# Patient Record
Sex: Male | Born: 2000 | Hispanic: No | Marital: Single | Smoking: Current every day smoker
Health system: Southern US, Community
[De-identification: ages and names within clinical notes are randomized; demographics above are authoritative.]

## PROBLEM LIST (undated history)

## (undated) ENCOUNTER — Emergency Department (HOSPITAL_COMMUNITY): Admission: EM | Payer: Medicaid Other | Source: Home / Self Care

## (undated) DIAGNOSIS — R569 Unspecified convulsions: Secondary | ICD-10-CM

## (undated) DIAGNOSIS — K047 Periapical abscess without sinus: Secondary | ICD-10-CM

## (undated) DIAGNOSIS — G40909 Epilepsy, unspecified, not intractable, without status epilepticus: Secondary | ICD-10-CM

## (undated) HISTORY — DX: Periapical abscess without sinus: K04.7

---
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
## 2021-04-06 ENCOUNTER — Ambulatory Visit (HOSPITAL_COMMUNITY)
Admission: EM | Admit: 2021-04-06 | Discharge: 2021-04-06 | Disposition: A | Discharge status: Home / Self Care | Payer: Medicaid Other | Attending: Physician Assistant | Admitting: Physician Assistant

## 2021-04-06 ENCOUNTER — Encounter (HOSPITAL_COMMUNITY): Payer: Self-pay | Admitting: Emergency Medicine

## 2021-04-06 ENCOUNTER — Other Ambulatory Visit: Payer: Self-pay

## 2021-04-06 DIAGNOSIS — H60331 Swimmer's ear, right ear: Secondary | ICD-10-CM

## 2021-04-06 DIAGNOSIS — H66001 Acute suppurative otitis media without spontaneous rupture of ear drum, right ear: Secondary | ICD-10-CM

## 2021-04-06 MED ORDER — AMOXICILLIN-POT CLAVULANATE 875-125 MG PO TABS: 1.0000 | ORAL_TABLET | Freq: Two times a day (BID) | ORAL | 0 refills | Status: DC

## 2021-04-06 MED ORDER — OFLOXACIN 0.3 % OT SOLN: 5.0000 [drp] | Freq: Every day | OTIC | 0 refills | Status: DC

## 2021-04-06 NOTE — Discharge Instructions (Signed)
Take Augmentin twice daily for 7 days.  Use eardrops in right ear daily.  Keep ear facing upward for several minutes after using medication to allow it to get all the way into your ear canal.  Use Tylenol and ibuprofen for pain relief.  Do not use ear buds, earplugs in your ear until symptoms resolve.  Avoid submerging your head until symptoms improve.  If your pain continues or you have any worsening symptoms despite medication use you need to be reevaluated.

## 2021-04-06 NOTE — ED Provider Notes (Signed)
[facility]    CSN: [id number] Arrival date & time: 04/06/21  1309      History   Chief Complaint Chief Complaint  Patient presents with   Otalgia    Bilateral, 2 weeks    HPI Timothy Mitchell is a 20 y.o. male.   Patient presents today with a 2-week history of worsening otalgia.  Reports pain is present in both ears but much worse on the right.  Pain is rated 8 on a 0-10 pain scale, localized to right ear with radiation into neck, described as aching/throbbing, worse with manipulation of right ear, no alleviating factors identified.  Patient does report recent travel to [place] where he was swimming prior to symptom onset.  He has not tried any over-the-counter medications for symptom management.  Denies any otorrhea, fever, decreased hearing, congestion, cough.  He denies any recent antibiotic use.  Denies history of allergies or asthma.  He has not seen ENT in the past.   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 Authorizing Provider  amoxicillin-clavulanate (AUGMENTIN) 875-125 MG tablet Take 1 tablet by mouth every 12 (twelve) hours. 04/06/21  Yes Ora Bollig K, PA-C  ofloxacin (FLOXIN) 0.3 % OTIC solution Place 5 drops into the right ear daily. 04/06/21  Yes Vayda Dungee  K, PA-C  diazepam (DIASTAT ACUDIAL) 10 MG GEL Place 10 mg rectally once. Give per package directions Patient not taking: Reported on 03/03/2017 06/07/14   Marcellina Millin, MD  ibuprofen (ADVIL,MOTRIN) 200 MG tablet Take 400 mg by mouth every 6 (six) hours as needed (pain).    [provider]  levETIRAcetam (KEPPRA) 1000 MG tablet Take 1 tablet (1,000 mg total) by mouth 2 (two) times daily. Patient not taking: No sig reported 10/13/15   Niel Hummer, MD    Family History Family History  Problem Relation Age of Onset   Depression Mother    Learning disabilities Mother    Mental illness Mother    Mental illness Father    Birth defects Sister    Alcohol abuse Maternal Grandmother    Diabetes Maternal Grandmother    Alcohol abuse Maternal Grandfather    Cancer Maternal Grandfather    Diabetes Maternal Grandfather    Heart disease Maternal Grandfather    Diabetes Paternal Grandmother     Social History Social History   Tobacco Use   Smoking status: Passive Smoke Exposure - Never Smoker   Smokeless tobacco: Never  Substance Use Topics   Alcohol use: No   Drug use: No     Allergies   Patient has no known allergies.   Review of Systems Review of Systems  Constitutional:  Negative for activity change, appetite change, fatigue and fever.  HENT:  Positive for ear discharge and ear pain. Negative for congestion, sinus pressure, sneezing and sore throat.   Respiratory:  Negative for cough and shortness of breath.   Cardiovascular:  Negative for chest pain.  Gastrointestinal:  Negative for abdominal pain, diarrhea, nausea and vomiting.  Musculoskeletal:  Negative for arthralgias and myalgias.  Neurological:  Negative for dizziness, light-headedness and headaches.    Physical Exam Triage Vital Signs ED Triage Vitals [04/06/21 1401]  Enc Vitals Group     BP 131/77     Pulse Rate 77     Resp 16     Temp 98 F (36.7 C)     Temp Source Oral     SpO2 99 %     Weight       Height      Head Circumference      Peak Flow      Pain Score 8     Pain Loc      Pain Edu?      Excl. in GC?    No data found.  Updated Vital Signs BP 131/77   Pulse 77   Temp 98 F (36.7 C) (Oral)   Resp 16   SpO2 99%   Visual Acuity Right Eye Distance:   Left Eye Distance:   Bilateral Distance:    Right Eye Near:   Left Eye Near:    Bilateral Near:     Physical Exam Vitals reviewed.  Constitutional:      General: He is awake.     Appearance: Normal appearance. He is normal weight. He is not ill-appearing.     Comments: Very pleasant male appears stated age in no acute distress  HENT:     Head: Normocephalic and atraumatic.     Right Ear: Ear canal and external ear normal. Swelling and tenderness present. Tympanic membrane is erythematous and bulging.     Left Ear: Tympanic membrane, ear canal and external ear normal. Tympanic membrane is not erythematous or bulging.     Ears:     Comments: Right ear: Tenderness palpation of tragus and pain with manipulation of external ear.  External auditory canal erythematous and edematous.  TM appears bulging and erythematous    Nose: Nose normal.     Mouth/Throat:     Pharynx: Uvula midline. No oropharyngeal exudate or posterior oropharyngeal erythema.  Cardiovascular:     Rate and Rhythm: Normal rate and regular rhythm.     Heart sounds: Normal heart sounds, S1 normal and S2 normal. No murmur heard. Pulmonary:     Effort: Pulmonary effort is normal. No accessory muscle usage or respiratory distress.     Breath sounds: Normal breath sounds. No stridor. No wheezing, rhonchi or rales.     Comments: Clear to auscultation bilaterally Lymphadenopathy:     Head:     Right side of head: No submental, submandibular or tonsillar adenopathy.     Left side of head: No submental, submandibular or tonsillar adenopathy.     Cervical: No cervical adenopathy.  Neurological:     Mental Status: He is alert.  Psychiatric:         Behavior: Behavior is cooperative.     UC Treatments / Results  Labs (all labs ordered are listed, but only abnormal results are displayed) Labs Reviewed - No data to display  EKG   Radiology No results found.  Procedures Procedures (including critical care time)  Medications Ordered in UC Medications - No data to display  Initial  Impression / Assessment and Plan / UC Course  I have reviewed the triage vital signs and the nursing notes.  Pertinent labs & imaging results that were available during my care of the patient were reviewed by me and considered in my medical decision making (see chart for details).     Otitis externa and otitis media identified on physical exam.  Patient was started on Augmentin twice daily for 7 days.  He was prescribed ofloxacin otic drops with instruction to apply this daily and keep ear facing upward for 5 to 10 minutes after use of drops to allow complete penetration throughout ear canal.  He can alternate Tylenol and ibuprofen for pain relief.  Discouraged him from putting anything in his ear including earbuds, earplugs, Q-tips particularly until symptoms improve.  He is not to submerge his head in water until symptoms resolve.  Discussed alarm symptoms that warrant emergent evaluation.  Strict return precautions given to which patient expressed understanding.  Final Clinical Impressions(s) / UC Diagnoses   Final diagnoses:  Acute swimmer's ear of right side  Non-recurrent acute suppurative otitis media of right ear without spontaneous rupture of tympanic membrane     Discharge Instructions      Take Augmentin twice daily for 7 days.  Use eardrops in right ear daily.  Keep ear facing upward for several minutes after using medication to allow it to get all the way into your ear canal.  Use Tylenol and ibuprofen for pain relief.  Do not use ear buds, earplugs in your ear until symptoms resolve.  Avoid submerging your head until symptoms improve.   If your pain continues or you have any worsening symptoms despite medication use you need to be reevaluated.     ED Prescriptions     Medication Sig Dispense Auth. Provider   amoxicillin-clavulanate (AUGMENTIN) 875-125 MG tablet Take 1 tablet by mouth every 12 (twelve) hours. 14 tablet Alysse Rathe K, PA-C   ofloxacin (FLOXIN) 0.3 % OTIC solution Place 5 drops into the right ear daily. 5 mL Ronique Simerly K, PA-C      PDMP not reviewed this encounter.   Jeani Hawking, PA-C 04/06/21 1424

## 2021-06-01 ENCOUNTER — Encounter (HOSPITAL_COMMUNITY): Payer: Self-pay | Admitting: Emergency Medicine

## 2021-06-01 ENCOUNTER — Ambulatory Visit (HOSPITAL_COMMUNITY)
Admission: EM | Admit: 2021-06-01 | Discharge: 2021-06-01 | Disposition: A | Discharge status: Home / Self Care | Payer: Medicaid Other | Attending: Urgent Care | Admitting: Urgent Care

## 2021-06-01 ENCOUNTER — Emergency Department (HOSPITAL_COMMUNITY)
Admission: EM | Admit: 2021-06-01 | Discharge: 2021-06-01 | Disposition: A | Discharge status: Home / Self Care | Payer: Medicaid Other | Attending: Physician Assistant | Admitting: Physician Assistant

## 2021-06-01 ENCOUNTER — Other Ambulatory Visit: Payer: Self-pay

## 2021-06-01 DIAGNOSIS — Z87898 Personal history of other specified conditions: Secondary | ICD-10-CM

## 2021-06-01 DIAGNOSIS — K625 Hemorrhage of anus and rectum: Secondary | ICD-10-CM | POA: Diagnosis not present

## 2021-06-01 DIAGNOSIS — Z5321 Procedure and treatment not carried out due to patient leaving prior to being seen by health care provider: Secondary | ICD-10-CM | POA: Insufficient documentation

## 2021-06-01 DIAGNOSIS — R200 Anesthesia of skin: Secondary | ICD-10-CM

## 2021-06-01 LAB — CBC WITH DIFFERENTIAL/PLATELET
Abs Immature Granulocytes: 0.01 10*3/uL (ref 0.00–0.07)
Basophils Absolute: 0 10*3/uL (ref 0.0–0.1)
Basophils Relative: 1 %
Eosinophils Absolute: 0.2 10*3/uL (ref 0.0–0.5)
Eosinophils Relative: 3 %
HCT: 47.1 % (ref 39.0–52.0)
Hemoglobin: 15.7 g/dL (ref 13.0–17.0)
Immature Granulocytes: 0 %
Lymphocytes Relative: 48 %
Lymphs Abs: 3.1 10*3/uL (ref 0.7–4.0)
MCH: 29 pg (ref 26.0–34.0)
MCHC: 33.3 g/dL (ref 30.0–36.0)
MCV: 87.1 fL (ref 80.0–100.0)
Monocytes Absolute: 0.6 10*3/uL (ref 0.1–1.0)
Monocytes Relative: 9 %
Neutro Abs: 2.5 10*3/uL (ref 1.7–7.7)
Neutrophils Relative %: 39 %
Platelets: 329 10*3/uL (ref 150–400)
RBC: 5.41 MIL/uL (ref 4.22–5.81)
RDW: 13 % (ref 11.5–15.5)
WBC: 6.3 10*3/uL (ref 4.0–10.5)
nRBC: 0 % (ref 0.0–0.2)

## 2021-06-01 LAB — TYPE AND SCREEN
ABO/RH(D): O POS
Antibody Screen: NEGATIVE

## 2021-06-01 LAB — COMPREHENSIVE METABOLIC PANEL
ALT: 56 U/L — ABNORMAL HIGH (ref 0–44)
AST: 34 U/L (ref 15–41)
Albumin: 4 g/dL (ref 3.5–5.0)
Alkaline Phosphatase: 75 U/L (ref 38–126)
Anion gap: 7 (ref 5–15)
BUN: 13 mg/dL (ref 6–20)
CO2: 26 mmol/L (ref 22–32)
Calcium: 9.4 mg/dL (ref 8.9–10.3)
Chloride: 103 mmol/L (ref 98–111)
Creatinine, Ser: 1.04 mg/dL (ref 0.61–1.24)
GFR, Estimated: >60 mL/min (ref >60)
Glucose, Bld: 129 mg/dL — ABNORMAL HIGH (ref 70–99)
Potassium: 3.8 mmol/L (ref 3.5–5.1)
Sodium: 136 mmol/L (ref 135–145)
Total Bilirubin: 0.7 mg/dL (ref 0.3–1.2)
Total Protein: 7.2 g/dL (ref 6.5–8.1)

## 2021-06-01 LAB — URINALYSIS, ROUTINE W REFLEX MICROSCOPIC
Bilirubin Urine: NEGATIVE
Glucose, UA: NEGATIVE mg/dL
Hgb urine dipstick: NEGATIVE
Ketones, ur: NEGATIVE mg/dL
Leukocytes,Ua: NEGATIVE
Nitrite: NEGATIVE
Protein, ur: NEGATIVE mg/dL
Specific Gravity, Urine: 1.029 (ref 1.005–1.030)
pH: 5 (ref 5.0–8.0)

## 2021-06-01 LAB — ABO/RH: ABO/RH(D): O POS

## 2021-06-01 MED ORDER — DOCUSATE SODIUM 100 MG PO CAPS: 100.0000 mg | ORAL_CAPSULE | Freq: Two times a day (BID) | ORAL | 0 refills | Status: DC

## 2021-06-01 NOTE — ED Triage Notes (Signed)
Patient with rectal bleeding this am.  Patient denies any abdominal pain, nausea or vomiting.  He states that he had a BM and there was BRB on the toilet paper.

## 2021-06-01 NOTE — ED Notes (Signed)
Mother wanted to take son to Urgent care due to wait time.

## 2021-06-01 NOTE — ED Provider Notes (Signed)
Emergency Medicine Provider Triage Evaluation Note  Timothy Mitchell , a 20 y.o. male  was evaluated in triage.  Pt complains of one episode of BRBPR while having a bowel movement earlier tonight.  Pt denies known constipation or straining.  Reports the blood was bright red and on the toilet paper.  Denies abd pain, vomiting, diarrhea.  Is not anticoagulated.  Review of Systems  Positive: BRBPR Negative: Abd pain, vomiting  Physical Exam  BP 129/73 (BP Location: Left Arm)   Pulse 61   Temp 98.4 F (36.9 C)   Resp 16   SpO2 96%  Gen:   Awake, no distress   Resp:  Normal effort  MSK:   Moves extremities without difficulty  Other:  Abd soft, nondistended  Medical Decision Making  Medically screening exam initiated at 6:53 AM.  Appropriate orders placed.  Timothy Mitchell was informed that the remainder of the evaluation will be completed by another provider, this initial triage assessment does not replace that evaluation, and the importance of remaining in the ED until their evaluation is complete.  Blood in stool   [name], [name] 06/01/21 5701    Tilden Fossa, MD 06/01/21 [phone]

## 2021-06-01 NOTE — ED Triage Notes (Signed)
Pt presents with blood in stool xs 3 times since this am. States was bloated a couple days ago.   Also states had a numbness feeling on back of head around 530 this am.

## 2021-06-02 ENCOUNTER — Telehealth: Payer: Self-pay

## 2021-06-02 NOTE — Telephone Encounter (Signed)
Transition Care Management Unsuccessful Follow-up Telephone Call  Date of discharge and from where:  06/01/2021-[facility]   Attempts:  1st Attempt  Reason for unsuccessful TCM follow-up call:  Unable to reach patient

## 2021-06-03 NOTE — Telephone Encounter (Signed)
Transition Care Management Unsuccessful Follow-up Telephone Call  Date of discharge and from where:  06/01/2021-[facility]  Attempts:  2nd Attempt  Reason for unsuccessful TCM follow-up call:  Unable to reach patient

## 2021-06-06 NOTE — Telephone Encounter (Signed)
Transition Care Management Unsuccessful Follow-up Telephone Call  Date of discharge and from where:  06/01/2021 from [facility]  Attempts:  3rd Attempt  Reason for unsuccessful TCM follow-up call:  Unable to reach patient

## 2022-06-30 ENCOUNTER — Ambulatory Visit (HOSPITAL_COMMUNITY)
Admission: EM | Admit: 2022-06-30 | Discharge: 2022-06-30 | Disposition: A | Discharge status: Home / Self Care | Payer: Medicaid Other | Attending: Internal Medicine | Admitting: Internal Medicine

## 2022-06-30 ENCOUNTER — Other Ambulatory Visit: Payer: Self-pay

## 2022-06-30 ENCOUNTER — Ambulatory Visit (INDEPENDENT_AMBULATORY_CARE_PROVIDER_SITE_OTHER): Payer: Medicaid Other

## 2022-06-30 ENCOUNTER — Encounter (HOSPITAL_COMMUNITY): Payer: Self-pay

## 2022-06-30 DIAGNOSIS — S62334A Displaced fracture of neck of fourth metacarpal bone, right hand, initial encounter for closed fracture: Secondary | ICD-10-CM

## 2022-06-30 DIAGNOSIS — S62306A Unspecified fracture of fifth metacarpal bone, right hand, initial encounter for closed fracture: Secondary | ICD-10-CM | POA: Diagnosis not present

## 2022-06-30 DIAGNOSIS — M79641 Pain in right hand: Secondary | ICD-10-CM | POA: Diagnosis not present

## 2022-06-30 DIAGNOSIS — M7989 Other specified soft tissue disorders: Secondary | ICD-10-CM | POA: Diagnosis not present

## 2022-06-30 DIAGNOSIS — S62339A Displaced fracture of neck of unspecified metacarpal bone, initial encounter for closed fracture: Secondary | ICD-10-CM

## 2022-06-30 MED ORDER — TRAMADOL HCL 50 MG PO TABS: 50.0000 mg | ORAL_TABLET | Freq: Four times a day (QID) | ORAL | 0 refills | Status: AC | PRN

## 2022-06-30 NOTE — ED Provider Notes (Signed)
[facility]    CSN: [id number] Arrival date & time: 06/30/22  1632      History   Chief Complaint Chief Complaint  Patient presents with   Hand Pain    HPI Timothy Mitchell is a 21 y.o. male.   21 year old male presents today due to concern of right hand pain.  He states 2 or 3 hours prior to arrival, out of anger and frustration he punched a tree.  He states he had immediate pain and swelling to the medial aspect of his hand.  Mom states she gave him 800 mg of ibuprofen roughly an hour prior to arrival.  He had a similar injury roughly a year ago, but states he never was seen for evaluation at that time.  He is and certain of any prior injuries.  He denies any loss of sensation or tingling in his fingers.   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 Authorizing Provider  traMADol (ULTRAM) 50 MG tablet Take 1 tablet (50 mg total) by mouth every 6 (six) hours as needed for up to 3 days for severe pain. 06/30/22 07/03/22 Yes Josclyn Rosales, Jodelle Gross, PA    Family History Family History  Problem Relation Age of Onset   Depression Mother    Learning disabilities Mother    Mental illness Mother    Mental illness Father    Birth defects Sister    Alcohol abuse  Maternal Grandmother    Diabetes Maternal Grandmother    Alcohol abuse Maternal Grandfather    Cancer Maternal Grandfather    Diabetes Maternal Grandfather    Heart disease Maternal Grandfather    Diabetes Paternal Grandmother     Social History Social History   Tobacco Use   Smoking status: Passive Smoke Exposure - Never Smoker   Smokeless tobacco: Never  Substance Use Topics   Alcohol use: No   Drug use: No     Allergies   Patient has no known allergies.   Review of Systems Review of Systems As per HPI  Physical Exam Triage Vital Signs ED Triage Vitals  Enc Vitals Group     BP 06/30/22 1816 137/71     Pulse Rate 06/30/22 1816 77     Resp 06/30/22 1816 18     Temp 06/30/22 1816 98.3 F (36.8 C)     Temp Source 06/30/22 1816 Oral     SpO2 06/30/22 1816 100 %     Weight --      Height --      Head Circumference --      Peak Flow --      Pain Score 06/30/22 1814 7     Pain Loc --  Pain Edu? --      Excl. in [place]? --    No data found.  Updated Vital Signs BP 137/71 (BP Location: Left Arm)   Pulse 77   Temp 98.3 F (36.8 C) (Oral)   Resp 18   SpO2 100%   Visual Acuity Right Eye Distance:   Left Eye Distance:   Bilateral Distance:    Right Eye Near:   Left Eye Near:    Bilateral Near:     Physical Exam Vitals and nursing note reviewed. Exam conducted with a chaperone present.  Constitutional:      General: He is not in acute distress.    Appearance: Normal appearance. He is normal weight. He is not ill-appearing or toxic-appearing.     Comments: Mild discomfort to R hand  HENT:     Head: Normocephalic and atraumatic.  Cardiovascular:     Rate and Rhythm: Normal rate.  Pulmonary:     Effort: Pulmonary effort is normal. No respiratory distress.  Musculoskeletal:        General: Swelling, tenderness and signs of injury present. Normal range of motion.     Comments: Moderate soft tissue swelling and ecchymosis noted to dorsal aspect of medial  hand over 4th and 5th metacarpal.  Skin:    General: Skin is warm.     Capillary Refill: Capillary refill takes less than 2 seconds.     Findings: No erythema or rash.  Neurological:     Mental Status: He is alert.     Sensory: No sensory deficit.     Comments: Decreased grip strength primarily due to swelling      UC Treatments / Results  Labs (all labs ordered are listed, but only abnormal results are displayed) Labs Reviewed - No data to display  EKG   Radiology DG Hand Complete Right  Result Date: 06/30/2022 CLINICAL DATA:  Right hand pain, punched a tree EXAM: RIGHT HAND - COMPLETE 3 VIEW COMPARISON:  07/13/2017 FINDINGS: Mildly displaced fractures with volar angulation in the mid fourth metacarpal and proximal fifth metacarpal. Neither fracture involves the articular surface. The joint spaces are preserved. Alignment is otherwise unremarkable. Soft tissue swelling about the hand. IMPRESSION: Fractures through the mid fourth metacarpal (ring finger) and proximal fifth metacarpal (pinky). Electronically Signed   By: Merilyn Baba M.D.   On: 06/30/2022 18:52    Procedures Procedures (including critical care time)  Medications Ordered in UC Medications - No data to display  Initial Impression / Assessment and Plan / UC Course  I have reviewed the triage vital signs and the nursing notes.  Pertinent labs & imaging results that were available during my care of the patient were reviewed by me and considered in my medical decision making (see chart for details).     4th and 5th metacarpal minimally displaced fractures - pt already had 800 mg ibuprofen administered prior to arrival.  Currently in a 7 out of 10 pain.  Will discharge with 3 days of tramadol for severe pain.  Recommended continued ibuprofen use every 6-8 hours, in addition to ice.  Ulnar gutter splint placed.  Patient instructed to call and follow-up with Ortho tomorrow.   Final Clinical Impressions(s) / UC  Diagnoses   Final diagnoses:  Closed boxer's fracture, initial encounter     Discharge Instructions      You have a fracture of your fourth and fifth metacarpal bone. Please continue taking 600 mg of ibuprofen with food every 6 hours. Take  the tramadol prescribed today up to every 6 hours as needed for severe pain. Please ice the area at least 3 times daily. Call [facility] in the morning to schedule a follow-up evaluation.   ED Prescriptions     Medication Sig Dispense Auth. Provider   traMADol (ULTRAM) 50 MG tablet Take 1 tablet (50 mg total) by mouth every 6 (six) hours as needed for up to 3 days for severe pain. 12 tablet Danica Camarena L, [place]      I have reviewed the PDMP during this encounter.   Maretta Bees, [place] 06/30/22 2014

## 2022-06-30 NOTE — Discharge Instructions (Addendum)
You have a fracture of your fourth and fifth metacarpal bone. Please continue taking 600 mg of ibuprofen with food every 6 hours. Take the tramadol prescribed today up to every 6 hours as needed for severe pain. Please ice the area at least 3 times daily. Call [facility] in the morning to schedule a follow-up evaluation.

## 2022-06-30 NOTE — ED Triage Notes (Addendum)
Patient with c/o right hand pain. States he punched a tree out of frustration. Swelling noted to top of right hand.

## 2022-06-30 NOTE — ED Notes (Signed)
Ortho tech called for splint placement. 

## 2022-06-30 NOTE — Progress Notes (Signed)
Orthopedic Tech Progress Note Patient Details:  Timothy Mitchell 2001-09-21 [id number]  Ortho Devices Type of Ortho Device: Ulna gutter splint Ortho Device/Splint Location: rue Ortho Device/Splint Interventions: Ordered, Application, Adjustment   Post Interventions Patient Tolerated: Well Instructions Provided: Care of device, Adjustment of device  Karolee Stamps 06/30/2022, 8:26 PM

## 2022-07-07 DIAGNOSIS — M79672 Pain in left foot: Secondary | ICD-10-CM | POA: Diagnosis not present

## 2022-07-07 DIAGNOSIS — M79641 Pain in right hand: Secondary | ICD-10-CM | POA: Diagnosis not present

## 2022-07-07 DIAGNOSIS — M79671 Pain in right foot: Secondary | ICD-10-CM | POA: Diagnosis not present

## 2022-07-18 DIAGNOSIS — S62346D Nondisplaced fracture of base of fifth metacarpal bone, right hand, subsequent encounter for fracture with routine healing: Secondary | ICD-10-CM | POA: Diagnosis not present

## 2022-07-18 DIAGNOSIS — S62354A Nondisplaced fracture of shaft of fourth metacarpal bone, right hand, initial encounter for closed fracture: Secondary | ICD-10-CM | POA: Diagnosis not present

## 2022-07-26 DIAGNOSIS — S62354A Nondisplaced fracture of shaft of fourth metacarpal bone, right hand, initial encounter for closed fracture: Secondary | ICD-10-CM | POA: Diagnosis not present

## 2022-07-26 DIAGNOSIS — S62346D Nondisplaced fracture of base of fifth metacarpal bone, right hand, subsequent encounter for fracture with routine healing: Secondary | ICD-10-CM | POA: Diagnosis not present

## 2023-02-19 ENCOUNTER — Ambulatory Visit (HOSPITAL_COMMUNITY)
Admission: EM | Admit: 2023-02-19 | Discharge: 2023-02-19 | Disposition: A | Discharge status: Home / Self Care | Payer: Medicaid Other | Attending: Internal Medicine | Admitting: Internal Medicine

## 2023-02-19 ENCOUNTER — Encounter (HOSPITAL_COMMUNITY): Payer: Self-pay | Admitting: *Deleted

## 2023-02-19 ENCOUNTER — Other Ambulatory Visit: Payer: Self-pay

## 2023-02-19 DIAGNOSIS — J029 Acute pharyngitis, unspecified: Secondary | ICD-10-CM | POA: Diagnosis not present

## 2023-02-19 LAB — POCT RAPID STREP A (OFFICE): Rapid Strep A Screen: NEGATIVE

## 2023-02-19 MED ORDER — AMOXICILLIN 500 MG PO CAPS: 1000.0000 mg | ORAL_CAPSULE | Freq: Every day | ORAL | 0 refills | Status: AC

## 2023-02-19 NOTE — Discharge Instructions (Addendum)
Your rapid strep was negative today in clinic, however, your physical exam is consistent with strep throat.  Please take all antibiotics as prescribed and until finished.  You can take them with food to prevent gastrointestinal upset.  For symptomatic relief of your sore throat you can alternate between Tylenol and ibuprofen every 4-6 hours.  You can do tea with honey, warm saline gargles and sleep with a humidifier.  Change out your toothbrush in the next day.  Please return to clinic or to your primary care provider if you do not have any improvement in your symptoms despite being on antibiotics, you develop shortness of breath, wheezing, or any new concerning symptoms.

## 2023-02-19 NOTE — ED Provider Notes (Signed)
[facility]    CSN: [id number] Arrival date & time: 02/19/23  1116      History   Chief Complaint Chief Complaint  Patient presents with   Sore Throat   Headache    HPI Timothy Mitchell is a 22 y.o. male.   Patient presents to clinic for complaints of a sore throat and headache for the past 2 days.  He has had hot and cold chills.  Has not checked his temperature.  He had tried some tea and this helped a sore throat.  Denies recent sick contacts.  Does report a nonproductive cough.  Denies any abdominal pain, nausea, vomiting or diarrhea. Able to eat and drink.    The history is provided by the patient and medical records.  Sore Throat Associated symptoms include headaches. Pertinent negatives include no chest pain, no abdominal pain and no shortness of breath.  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 Authorizing Provider  amoxicillin (AMOXIL) 500 MG capsule Take 2 capsules (1,000 mg total) by mouth daily for 10 days. 02/19/23 03/01/23 Yes Kandon Hosking, [place] N, FNP    Family History Family History  Problem Relation Age of Onset    Depression Mother    Learning disabilities Mother    Mental illness Mother    Mental illness Father    Birth defects Sister    Alcohol abuse Maternal Grandmother    Diabetes Maternal Grandmother    Alcohol abuse Maternal Grandfather    Cancer Maternal Grandfather    Diabetes Maternal Grandfather    Heart disease Maternal Grandfather    Diabetes Paternal Grandmother     Social History Social History   Tobacco Use   Smoking status: Passive Smoke Exposure - Never Smoker   Smokeless tobacco: Never  Substance Use Topics   Alcohol use: No   Drug use: No     Allergies   Patient has no known allergies.   Review of Systems Review of Systems  Constitutional:  Positive for chills.  HENT:  Positive for sore throat. Negative for trouble swallowing and voice change.   Respiratory:  Positive for cough. Negative for shortness of breath and wheezing.   Cardiovascular:  Negative for chest pain.  Gastrointestinal:  Negative for abdominal pain, diarrhea, nausea and vomiting.  Neurological:  Positive for headaches.     Physical Exam Triage Vital Signs ED Triage Vitals  Enc Vitals Group     BP 02/19/23 1150 128/79     Pulse Rate 02/19/23 1150 83     Resp 02/19/23 1150 16     Temp  02/19/23 1150 99.1 F (37.3 C)     Temp src --      SpO2 02/19/23 1150 98 %     Weight --      Height --      Head Circumference --      Peak Flow --      Pain Score 02/19/23 1149 7     Pain Loc --      Pain Edu? --      Excl. in [place]? --    No data found.  Updated Vital Signs BP 128/79   Pulse 83   Temp 99.1 F (37.3 C)   Resp 16   SpO2 98%   Visual Acuity Right Eye Distance:   Left Eye Distance:   Bilateral Distance:    Right Eye Near:   Left Eye Near:    Bilateral Near:     Physical Exam Vitals and nursing note reviewed.  Constitutional:      Appearance: He is well-developed.  HENT:     Head: Normocephalic and atraumatic.     Nose: No congestion or rhinorrhea.      Mouth/Throat:     Lips: Pink.     Mouth: Mucous membranes are moist.     Pharynx: Uvula midline. Posterior oropharyngeal erythema present.     Tonsils: Tonsillar exudate present. No tonsillar abscesses. 2+ on the right. 2+ on the left.  Eyes:     Conjunctiva/sclera: Conjunctivae normal.  Cardiovascular:     Rate and Rhythm: Normal rate and regular rhythm.     Heart sounds: Normal heart sounds. No murmur heard. Pulmonary:     Effort: Pulmonary effort is normal. No respiratory distress.     Breath sounds: Normal breath sounds.  Musculoskeletal:     Cervical back: Normal range of motion.  Neurological:     General: No focal deficit present.     Mental Status: He is alert and oriented to person, place, and time.  Psychiatric:        Mood and Affect: Mood normal.        Behavior: Behavior normal.      UC Treatments / Results  Labs (all labs ordered are listed, but only abnormal results are displayed) Labs Reviewed  CULTURE, GROUP A STREP [facility])  POCT RAPID STREP A (OFFICE)    EKG   Radiology No results found.  Procedures Procedures (including critical care time)  Medications Ordered in UC Medications - No data to display  Initial Impression / Assessment and Plan / UC Course  I have reviewed the triage vital signs and the nursing notes.  Pertinent labs & imaging results that were available during my care of the patient were reviewed by me and considered in my medical decision making (see chart for details).  Vitals in triage reviewed, patient is hemodynamically stable.  Posterior pharynx with erythema, uvula midline, tonsils with 2+ edema and bilateral scant white patches.  Low concern for peritonsillar abscess.  Rapid strep negative, will send for culture.  Due to symptoms and presentation, will cover with amoxicillin for strep pharyngitis. Symptomatic management reviewed.  Plan of care, follow-up care and return precautions discussed, no questions at this time.      Final Clinical Impressions(s) / UC Diagnoses   Final diagnoses:  Pharyngitis, unspecified etiology     Discharge Instructions      Your rapid strep was negative today in clinic, however, your physical exam is consistent with strep throat.  Please take all antibiotics as  prescribed and until finished.  You can take them with food to prevent gastrointestinal upset.  For symptomatic relief of your sore throat you can alternate between Tylenol and ibuprofen every 4-6 hours.  You can do tea with honey, warm saline gargles and sleep with a humidifier.  Change out your toothbrush in the next day.  Please return to clinic or to your primary care provider if you do not have any improvement in your symptoms despite being on antibiotics, you develop shortness of breath, wheezing, or any new concerning symptoms.      ED Prescriptions     Medication Sig Dispense Auth. Provider   amoxicillin (AMOXIL) 500 MG capsule Take 2 capsules (1,000 mg total) by mouth daily for 10 days. 20 capsule Carolene Gitto, [place] N, [place]      PDMP not reviewed this encounter.   Aedyn Mckeon, [place] N, [place] 02/19/23 1218

## 2023-02-19 NOTE — ED Triage Notes (Signed)
Pt reports he has a sore throat for 2 days.with a HA.

## 2023-02-21 LAB — CULTURE, GROUP A STREP (THRC)

## 2023-04-25 ENCOUNTER — Ambulatory Visit (HOSPITAL_COMMUNITY)
Admission: EM | Admit: 2023-04-25 | Discharge: 2023-04-25 | Disposition: A | Discharge status: Home / Self Care | Payer: Medicaid Other

## 2023-04-25 ENCOUNTER — Encounter (HOSPITAL_COMMUNITY): Payer: Self-pay

## 2023-04-25 DIAGNOSIS — F439 Reaction to severe stress, unspecified: Secondary | ICD-10-CM

## 2023-04-25 NOTE — ED Triage Notes (Signed)
Pt states he needs a note for work. States he was with his significant other yesterday and she was threatening suicide. Pt denies any suicidal ideations just states he went through a lot with her.

## 2023-04-25 NOTE — Discharge Instructions (Addendum)
You have been provided information for you for [facility] for urgent care services as needed.

## 2023-04-25 NOTE — ED Provider Notes (Signed)
[facility]    CSN: [id number] Arrival date & time: 04/25/23  1154      History   Chief Complaint No chief complaint on file.   HPI Timothy Mitchell is a 22 y.o. male.   HPI  He is in today for for a work note.  He reports that he was caring for his 71 year old ex-girlfriend who is having some suicidal ideation.  He reports that she is doing better now.  He has a new job and he wants to protect his job.  He denies any suicidal ideations.   Past Medical History:  Diagnosis Date   Dental abscess 08/22/2011   Seizure disorder (HCC)    Last seizure seen approx 2 years ago   Seizures [facility])     Patient Active Problem List   Diagnosis Date Noted   Suicidal ideation 10/05/2017   Abnormal finding on EKG 12/17/2015   Seizure disorder, grand mal (HCC) 08/22/2011   Absence seizure disorder (HCC) 08/22/2011    Past Surgical History:  Procedure Laterality Date   NERVE, TENDON AND ARTERY REPAIR Right 06/10/2014   Procedure: RIGHT RING FINGER EXPLORATION WITH NERVE  REPAIR;  Surgeon: Betha Loa, MD;  Location: [facility];  Service: Orthopedics;  Laterality: Right;       Home Medications    Prior to Admission medications   Not on File    Family History Family History  Problem Relation Age of Onset   Depression Mother    Learning disabilities Mother    Mental illness Mother    Mental illness Father    Birth defects Sister    Alcohol abuse Maternal Grandmother    Diabetes Maternal Grandmother    Alcohol abuse Maternal Grandfather    Cancer Maternal Grandfather    Diabetes Maternal Grandfather    Heart disease Maternal Grandfather    Diabetes Paternal Grandmother     Social History Social History   Tobacco Use   Smoking status: Passive Smoke Exposure - Never Smoker   Smokeless tobacco: Never  Substance Use Topics   Alcohol use: No   Drug use: No     Allergies   Patient has no known allergies.   Review of Systems Review of  Systems   Physical Exam Triage Vital Signs ED Triage Vitals  Encounter Vitals Group     BP 04/25/23 1220 119/78     Systolic BP Percentile --      Diastolic BP Percentile --      Pulse Rate 04/25/23 1220 80     Resp 04/25/23 1220 16     Temp 04/25/23 1220 98.3 F (36.8 C)     Temp Source 04/25/23 1220 Oral     SpO2 04/25/23 1220 96 %     Weight --      Height --      Head Circumference --      Peak Flow --      Pain Score 04/25/23 1222 0     Pain Loc --      Pain Education --      Exclude from Growth Chart --    No data found.  Updated Vital Signs BP 119/78 (BP Location: Left Arm)   Pulse 80   Temp 98.3 F (36.8 C) (Oral)   Resp 16   SpO2 96%   Visual Acuity Right Eye Distance:   Left Eye Distance:   Bilateral Distance:    Right Eye Near:   Left Eye Near:  Bilateral Near:     Physical Exam Constitutional:      General: He is not in acute distress.    Appearance: He is obese.  HENT:     Head: Normocephalic and atraumatic.  Cardiovascular:     Rate and Rhythm: Normal rate.  Pulmonary:     Effort: Pulmonary effort is normal.  Musculoskeletal:        General: Normal range of motion.  Skin:    General: Skin is warm and dry.     Capillary Refill: Capillary refill takes less than 2 seconds.  Neurological:     General: No focal deficit present.     Mental Status: He is alert.  Psychiatric:     Comments: Quite but expressed appreciation post vist       UC Treatments / Results  Labs (all labs ordered are listed, but only abnormal results are displayed) Labs Reviewed - No data to display  EKG   Radiology No results found.  Procedures Procedures (including critical care time)  Medications Ordered in UC Medications - No data to display  Initial Impression / Assessment and Plan / UC Course  I have reviewed the triage vital signs and the nursing notes.  Pertinent labs & imaging results that were available during my care of the patient were  reviewed by me and considered in my medical decision making (see chart for details).     Work note Final Clinical Impressions(s) / UC Diagnoses   Final diagnoses:  Stress     Discharge Instructions      You have been provided information for you for [facility] for urgent care services as needed.     ED Prescriptions   None    PDMP not reviewed this encounter.   Thad Ranger [place], [place] 04/25/23 1354

## 2023-10-09 ENCOUNTER — Ambulatory Visit (HOSPITAL_COMMUNITY)
Admission: EM | Admit: 2023-10-09 | Discharge: 2023-10-09 | Disposition: A | Discharge status: Home / Self Care | Payer: Self-pay | Attending: Internal Medicine | Admitting: Internal Medicine

## 2023-10-09 ENCOUNTER — Encounter (HOSPITAL_COMMUNITY): Payer: Self-pay | Admitting: Emergency Medicine

## 2023-10-09 DIAGNOSIS — Z202 Contact with and (suspected) exposure to infections with a predominantly sexual mode of transmission: Secondary | ICD-10-CM | POA: Insufficient documentation

## 2023-10-09 MED ORDER — METRONIDAZOLE 500 MG PO TABS: 500.0000 mg | ORAL_TABLET | Freq: Two times a day (BID) | ORAL | 0 refills | Status: DC

## 2023-10-09 NOTE — Discharge Instructions (Addendum)
Please abstain from sexual intercourse until lab results are available I have sent metronidazole to the pharmacy.  Please pick the metronidazole up if trichomonas is positive If your test results are positive you will need to abstain from sexual intercourse for 7 days after treatment is started. Will call you with recommendations if labs are abnormal Feel free to return to urgent care if you have any other concerns

## 2023-10-09 NOTE — ED Provider Notes (Signed)
[facility]    CSN: [id number] Arrival date & time: 10/09/23  0909      History   Chief Complaint Chief Complaint  Patient presents with   Exposure to STD    HPI Timothy Mitchell is a 22 y.o. male comes to the urgent care for STD testing.  He was recently told by sexual partner that she had been diagnosed and treated for trichomonas infection.  Patient has been sexually involved with her.  The sexual encounter was unprotected.  Patient has no symptoms. HPI  Past Medical History:  Diagnosis Date   Dental abscess 08/22/2011   Seizure disorder (HCC)    Last seizure seen approx 2 years ago   Seizures [facility])     Patient Active Problem List   Diagnosis Date Noted   Suicidal ideation 10/05/2017   Abnormal finding on EKG 12/17/2015   Seizure disorder, grand mal (HCC) 08/22/2011   Absence seizure disorder (HCC) 08/22/2011    Past Surgical History:  Procedure Laterality Date   NERVE, TENDON AND ARTERY REPAIR Right 06/10/2014   Procedure: RIGHT RING FINGER EXPLORATION WITH NERVE  REPAIR;  Surgeon: Betha Loa, MD;  Location: [facility];  Service: Orthopedics;  Laterality: Right;       Home Medications    Prior to Admission medications   Medication Sig Start Date End Date Taking? Authorizing Provider  metroNIDAZOLE (FLAGYL) 500 MG tablet Take 1 tablet (500 mg total) by mouth 2 (two) times daily. 10/09/23  Yes Kavita Bartl, Britta Mccreedy, MD    Family History Family History  Problem Relation Age of Onset   Depression Mother    Learning disabilities Mother    Mental illness Mother    Mental illness Father    Birth defects Sister    Alcohol abuse Maternal Grandmother    Diabetes Maternal Grandmother    Alcohol abuse Maternal Grandfather    Cancer Maternal Grandfather    Diabetes Maternal Grandfather    Heart disease Maternal Grandfather    Diabetes Paternal Grandmother     Social History Social History   Tobacco Use   Smoking status: Every Day     Types: Cigarettes    Passive exposure: Yes   Smokeless tobacco: Never  Vaping Use   Vaping status: Never Used  Substance Use Topics   Alcohol use: No   Drug use: Yes    Types: Marijuana     Allergies   Patient has no known allergies.   Review of Systems Review of Systems  All other systems reviewed and are negative.    Physical Exam Triage Vital Signs ED Triage Vitals  Encounter Vitals Group     BP 10/09/23 0958 112/71     Systolic BP Percentile --      Diastolic BP Percentile --      Pulse Rate 10/09/23 0958 78     Resp 10/09/23 0958 18     Temp 10/09/23 0958 98 F (36.7 C)     Temp Source 10/09/23 0958 Oral     SpO2 10/09/23 0958 95 %     Weight 10/09/23 1000 240 lb (108.9 kg)     Height 10/09/23 1000 6\' 1"  (1.854 m)     Head Circumference --      Peak Flow --      Pain Score 10/09/23 0959 0     Pain Loc --      Pain Education --      Exclude from Growth Chart --  No data found.  Updated Vital Signs BP 112/71 (BP Location: Left Arm)   Pulse 78   Temp 98 F (36.7 C) (Oral)   Resp 18   Ht 6\' 1"  (1.854 m)   Wt 108.9 kg   SpO2 95%   BMI 31.66 kg/m   Visual Acuity Right Eye Distance:   Left Eye Distance:   Bilateral Distance:    Right Eye Near:   Left Eye Near:    Bilateral Near:     Physical Exam Vitals and nursing note reviewed.  Constitutional:      General: He is not in acute distress.    Appearance: He is not ill-appearing.  Genitourinary:    Comments: Deferred Neurological:     General: No focal deficit present.     Mental Status: He is alert and oriented to person, place, and time.  Psychiatric:        Mood and Affect: Mood normal.        Behavior: Behavior normal.      UC Treatments / Results  Labs (all labs ordered are listed, but only abnormal results are displayed) Labs Reviewed  CYTOLOGY, (ORAL, ANAL, URETHRAL) ANCILLARY ONLY    EKG   Radiology No results found.  Procedures Procedures (including critical care  time)  Medications Ordered in UC Medications - No data to display  Initial Impression / Assessment and Plan / UC Course  I have reviewed the triage vital signs and the nursing notes.  Pertinent labs & imaging results that were available during my care of the patient were reviewed by me and considered in my medical decision making (see chart for details).     1.  STD screening: Cytology for GC/chlamydia/trichomonas Metronidazole 500 mg twice daily for 7 days Abstinence from sexual intercourse emphasized Return precautions given Final Clinical Impressions(s) / UC Diagnoses   Final diagnoses:  Exposure to trichomonas     Discharge Instructions      Please abstain from sexual intercourse until lab results are available I have sent metronidazole to the pharmacy.  Please pick the metronidazole up if trichomonas is positive If your test results are positive you will need to abstain from sexual intercourse for 7 days after treatment is started. Will call you with recommendations if labs are abnormal Feel free to return to urgent care if you have any other concerns   ED Prescriptions     Medication Sig Dispense Auth. Provider   metroNIDAZOLE (FLAGYL) 500 MG tablet Take 1 tablet (500 mg total) by mouth 2 (two) times daily. 14 tablet Paxton Kanaan, Britta Mccreedy, MD      PDMP not reviewed this encounter.   Merrilee Jansky, MD 10/09/23 [phone]

## 2023-10-09 NOTE — ED Triage Notes (Signed)
Patient c/o no sx's, exposure to STI, states she has trichomonas.  Patient would like STI testing, denies bloodwork.

## 2023-10-10 LAB — CYTOLOGY, (ORAL, ANAL, URETHRAL) ANCILLARY ONLY
Chlamydia: NEGATIVE
Comment: NEGATIVE
Comment: NEGATIVE
Comment: NORMAL
Neisseria Gonorrhea: NEGATIVE
Trichomonas: POSITIVE — AB

## 2024-02-17 ENCOUNTER — Ambulatory Visit (INDEPENDENT_AMBULATORY_CARE_PROVIDER_SITE_OTHER): Payer: Self-pay

## 2024-02-17 ENCOUNTER — Encounter (HOSPITAL_COMMUNITY): Payer: Self-pay

## 2024-02-17 ENCOUNTER — Ambulatory Visit (HOSPITAL_COMMUNITY)
Admission: EM | Admit: 2024-02-17 | Discharge: 2024-02-17 | Disposition: A | Discharge status: Home / Self Care | Payer: Self-pay | Attending: Emergency Medicine | Admitting: Emergency Medicine

## 2024-02-17 DIAGNOSIS — R051 Acute cough: Secondary | ICD-10-CM

## 2024-02-17 DIAGNOSIS — Z113 Encounter for screening for infections with a predominantly sexual mode of transmission: Secondary | ICD-10-CM | POA: Insufficient documentation

## 2024-02-17 DIAGNOSIS — J988 Other specified respiratory disorders: Secondary | ICD-10-CM | POA: Insufficient documentation

## 2024-02-17 DIAGNOSIS — B9789 Other viral agents as the cause of diseases classified elsewhere: Secondary | ICD-10-CM | POA: Insufficient documentation

## 2024-02-17 DIAGNOSIS — J4521 Mild intermittent asthma with (acute) exacerbation: Secondary | ICD-10-CM | POA: Insufficient documentation

## 2024-02-17 LAB — HIV ANTIBODY (ROUTINE TESTING W REFLEX): HIV Screen 4th Generation wRfx: NONREACTIVE

## 2024-02-17 MED ORDER — ALBUTEROL SULFATE HFA 108 (90 BASE) MCG/ACT IN AERS
2.0000 | INHALATION_SPRAY | Freq: Once | RESPIRATORY_TRACT | Status: AC
  Administered 2024-02-17: 2 via RESPIRATORY_TRACT

## 2024-02-17 MED ORDER — ALBUTEROL SULFATE HFA 108 (90 BASE) MCG/ACT IN AERS
INHALATION_SPRAY | RESPIRATORY_TRACT | Status: AC
  Filled 2024-02-17: qty 6.7

## 2024-02-17 MED ORDER — IPRATROPIUM-ALBUTEROL 0.5-2.5 (3) MG/3ML IN SOLN
3.0000 mL | Freq: Once | RESPIRATORY_TRACT | Status: AC
  Administered 2024-02-17: 3 mL via RESPIRATORY_TRACT

## 2024-02-17 MED ORDER — AZELASTINE HCL 0.1 % NA SOLN: 2.0000 | Freq: Two times a day (BID) | NASAL | 0 refills | Status: AC

## 2024-02-17 MED ORDER — BENZONATATE 100 MG PO CAPS: 100.0000 mg | ORAL_CAPSULE | Freq: Three times a day (TID) | ORAL | 0 refills | Status: DC

## 2024-02-17 MED ORDER — PREDNISONE 20 MG PO TABS: 40.0000 mg | ORAL_TABLET | Freq: Every day | ORAL | 0 refills | Status: AC

## 2024-02-17 MED ORDER — IPRATROPIUM-ALBUTEROL 0.5-2.5 (3) MG/3ML IN SOLN
RESPIRATORY_TRACT | Status: AC
  Filled 2024-02-17: qty 3

## 2024-02-17 NOTE — ED Provider Notes (Signed)
 [facility]    CSN: [id number] Arrival date & time: 02/17/24  1549      History   Chief Complaint Chief Complaint  Patient presents with   Cough   SEXUALLY TRANSMITTED DISEASE    Screening    HPI Timothy Mitchell is a 23 y.o. male.   Patient presents with nasal and chest congestion, cough, loss of appetite, bilateral ear pain, and intermittent wheezing x 1 week.  Patient denies known fever, body aches, chills, shortness of breath, chest pain, abdominal pain, nausea, vomiting, and diarrhea.   Patient reports history of childhood asthma but reports that he has never had to use an inhaler.  Patient states that his mother has been recently sick with similar symptoms.  Patient reports taking Mucinex with some relief.  Patient is also requesting STD testing.  Denies penile discharge, penile/testicular pain or swelling, genital lesions, dysuria, and flank pain.  Patient reports recent unprotected sexual intercourse.  Denies any known exposures to STDs.    The history is provided by the patient and medical records.  Cough   Past Medical History:  Diagnosis Date   Dental abscess 08/22/2011   Seizure disorder (HCC)    Last seizure seen approx 2 years ago   Seizures [facility])     Patient Active Problem List   Diagnosis Date Noted   Suicidal ideation 10/05/2017   Abnormal finding on EKG 12/17/2015   Seizure disorder, grand mal (HCC) 08/22/2011   Absence seizure disorder (HCC) 08/22/2011    Past Surgical History:  Procedure Laterality Date   NERVE, TENDON AND ARTERY REPAIR Right 06/10/2014   Procedure: RIGHT RING FINGER EXPLORATION WITH NERVE  REPAIR;  Surgeon: Brunilda Capra, MD;  Location: [facility];  Service: Orthopedics;  Laterality: Right;       Home Medications    Prior to Admission medications   Medication Sig Start Date End Date Taking? Authorizing Provider  azelastine (ASTELIN) 0.1 % nasal spray Place 2 sprays into both nostrils 2 (two) times  daily. Use in each nostril as directed 02/17/24  Yes Levora Reas A, NP  benzonatate (TESSALON) 100 MG capsule Take 1 capsule (100 mg total) by mouth every 8 (eight) hours. 02/17/24  Yes Rosevelt Constable, Rommel Hogston A, NP  predniSONE (DELTASONE) 20 MG tablet Take 2 tablets (40 mg total) by mouth daily for 5 days. 02/17/24 02/22/24 Yes Karon Packer, NP    Family History Family History  Problem Relation Age of Onset   Depression Mother    Learning disabilities Mother    Mental illness Mother    Mental illness Father    Birth defects Sister    Alcohol abuse Maternal Grandmother    Diabetes Maternal Grandmother    Alcohol abuse Maternal Grandfather    Cancer Maternal Grandfather    Diabetes Maternal Grandfather    Heart disease Maternal Grandfather    Diabetes Paternal Grandmother     Social History Social History   Tobacco Use   Smoking status: Every Day    Types: Cigarettes    Passive exposure: Yes   Smokeless tobacco: Never  Vaping Use   Vaping status: Never Used  Substance Use Topics   Alcohol use: No   Drug use: Yes    Types: Marijuana     Allergies   Patient has no known allergies.   Review of Systems Review of Systems  Respiratory:  Positive for cough.    Per HPI  Physical Exam Triage Vital Signs ED Triage Vitals  Encounter Vitals Group     BP 02/17/24 1626 125/78     Systolic BP Percentile --      Diastolic BP Percentile --      Pulse Rate 02/17/24 1626 60     Resp 02/17/24 1626 16     Temp 02/17/24 1626 98 F (36.7 C)     Temp Source 02/17/24 1626 Oral     SpO2 02/17/24 1626 97 %     Weight --      Height --      Head Circumference --      Peak Flow --      Pain Score 02/17/24 1625 3     Pain Loc --      Pain Education --      Exclude from Growth Chart --    No data found.  Updated Vital Signs BP 125/78 (BP Location: Left Arm)   Pulse 60   Temp 98 F (36.7 C) (Oral)   Resp 16   SpO2 97%   Visual Acuity Right Eye Distance:   Left Eye  Distance:   Bilateral Distance:    Right Eye Near:   Left Eye Near:    Bilateral Near:     Physical Exam Vitals and nursing note reviewed.  Constitutional:      General: He is awake. He is not in acute distress.    Appearance: Normal appearance. He is well-developed and well-groomed. He is not ill-appearing.  HENT:     Right Ear: Tympanic membrane, ear canal and external ear normal.     Left Ear: Tympanic membrane, ear canal and external ear normal.     Nose: Congestion and rhinorrhea present.     Mouth/Throat:     Mouth: Mucous membranes are moist.     Pharynx: Posterior oropharyngeal erythema present. No oropharyngeal exudate.  Cardiovascular:     Rate and Rhythm: Normal rate and regular rhythm.  Pulmonary:     Effort: Pulmonary effort is normal.     Breath sounds: Examination of the right-upper field reveals wheezing. Examination of the left-lower field reveals wheezing. Wheezing present.  Genitourinary:    Comments: Exam deferred Skin:    General: Skin is warm and dry.  Neurological:     Mental Status: He is alert.  Psychiatric:        Behavior: Behavior is cooperative.      UC Treatments / Results  Labs (all labs ordered are listed, but only abnormal results are displayed) Labs Reviewed  HIV ANTIBODY (ROUTINE TESTING W REFLEX)  RPR  CYTOLOGY, (ORAL, ANAL, URETHRAL) ANCILLARY ONLY    EKG   Radiology No results found.  Procedures Procedures (including critical care time)  Medications Ordered in UC Medications  ipratropium-albuterol (DUONEB) 0.5-2.5 (3) MG/3ML nebulizer solution 3 mL (3 mLs Nebulization Given 02/17/24 1656)  albuterol (VENTOLIN HFA) 108 (90 Base) MCG/ACT inhaler 2 puff (2 puffs Inhalation Given 02/17/24 1806)    Initial Impression / Assessment and Plan / UC Course  I have reviewed the triage vital signs and the nursing notes.  Pertinent labs & imaging results that were available during my care of the patient were reviewed by me and  considered in my medical decision making (see chart for details).     Patient is well-appearing.  Vitals are stable.  GU exam deferred.  Patient performed self swab for STD.  HIV and RPR ordered.   Upon assessment congestion and rhinorrhea present, mild erythema with PND noted to pharynx.  Mild  wheezing auscultated in right upper and left lower lung.  Chest x-ray ordered.  Based on my interpretation there is no active cardiopulmonary disease.  Given DuoNeb with relief of wheezing and symptomatic relief.  Given albuterol inhaler in clinic.  Prescribed prednisone burst for asthma exacerbation.  Prescribed Tessalon as needed for cough.  Prescribed azelastine to help with congestion.  Discussed follow-up, return, and strict ER precautions. Final Clinical Impressions(s) / UC Diagnoses   Final diagnoses:  Acute cough  Viral respiratory illness  Mild intermittent asthma with acute exacerbation  Screening for STD (sexually transmitted disease)     Discharge Instructions      Starting prednisone 2 tablets once daily for 5 days to help with inflammation in your lungs causing your wheezing. Take Tessalon every 8 hours as needed for cough. Use azelastine nasal spray twice daily to help with congestion. Use albuterol inhaler every 6 hours as needed for shortness of breath and wheezing. You can continue to use Mucinex for cough and congestion. I have attached the [facility] that you can schedule an appointment with to get established with a primary care provider for further management of your asthma. Your results will come back over the next few days and someone will call if results are positive and require treatment.  Return here as needed. Return here for symptoms persist or worsen. If you develop trouble breathing, chest pain, fevers unrelieved by medication, or passing out please seek immediate medical treatment in the emergency department..   ED Prescriptions      Medication Sig Dispense Auth. Provider   predniSONE (DELTASONE) 20 MG tablet Take 2 tablets (40 mg total) by mouth daily for 5 days. 10 tablet Levora Reas A, NP   benzonatate (TESSALON) 100 MG capsule Take 1 capsule (100 mg total) by mouth every 8 (eight) hours. 21 capsule Rosevelt Constable, Dawana Asper A, NP   azelastine (ASTELIN) 0.1 % nasal spray Place 2 sprays into both nostrils 2 (two) times daily. Use in each nostril as directed 30 mL Levora Reas A, NP      PDMP not reviewed this encounter.   Levora Reas A, NP 02/17/24 [phone]

## 2024-02-17 NOTE — Discharge Instructions (Addendum)
 Starting prednisone 2 tablets once daily for 5 days to help with inflammation in your lungs causing your wheezing. Take Tessalon every 8 hours as needed for cough. Use azelastine nasal spray twice daily to help with congestion. Use albuterol inhaler every 6 hours as needed for shortness of breath and wheezing. You can continue to use Mucinex for cough and congestion. I have attached the  community [facility] that you can schedule an appointment with to get established with a primary care provider for further management of your asthma. Your results will come back over the next few days and someone will call if results are positive and require treatment.  Return here as needed. Return here for symptoms persist or worsen. If you develop trouble breathing, chest pain, fevers unrelieved by medication, or passing out please seek immediate medical treatment in the emergency department.Aaron Aas

## 2024-02-17 NOTE — ED Triage Notes (Signed)
 Chief Complaint: congestion, bilateral ear pain, wheezing, slight cough, and loss of appetite.   Sick exposure: Yes- Mother sick today and his uncle was sick as well.   Onset: 1 week ago, ear problem for a few months.   Prescriptions or OTC medications tried: Yes- Mucinex    with mild relief  New foods, medications, or products: No  Recent Travel: No

## 2024-02-17 NOTE — ED Triage Notes (Signed)
 Patient also requesting routine STD testing to include swab and blood work. No current symptoms or known exposure.

## 2024-02-18 LAB — RPR: RPR Ser Ql: NONREACTIVE

## 2024-02-19 LAB — CYTOLOGY, (ORAL, ANAL, URETHRAL) ANCILLARY ONLY
Chlamydia: NEGATIVE
Comment: NEGATIVE
Comment: NEGATIVE
Comment: NORMAL
Neisseria Gonorrhea: NEGATIVE
Trichomonas: NEGATIVE

## 2024-03-25 ENCOUNTER — Encounter (HOSPITAL_COMMUNITY): Payer: Self-pay | Admitting: Emergency Medicine

## 2024-03-25 ENCOUNTER — Ambulatory Visit (HOSPITAL_COMMUNITY)
Admission: EM | Admit: 2024-03-25 | Discharge: 2024-03-25 | Disposition: A | Discharge status: Home / Self Care | Payer: Self-pay | Attending: Emergency Medicine | Admitting: Emergency Medicine

## 2024-03-25 DIAGNOSIS — Z202 Contact with and (suspected) exposure to infections with a predominantly sexual mode of transmission: Secondary | ICD-10-CM | POA: Insufficient documentation

## 2024-03-25 MED ORDER — METRONIDAZOLE 500 MG PO TABS
2000.0000 mg | ORAL_TABLET | Freq: Once | ORAL | Status: AC
  Administered 2024-03-25: 2000 mg via ORAL

## 2024-03-25 MED ORDER — METRONIDAZOLE 500 MG PO TABS
ORAL_TABLET | ORAL | Status: AC
  Filled 2024-03-25: qty 4

## 2024-03-25 NOTE — ED Triage Notes (Signed)
 Pt exposed to Trich. Requesting testing. Denies any discharge, urinary problems.

## 2024-03-25 NOTE — ED Provider Notes (Signed)
 [facility]    CSN: [id number] Arrival date & time: 03/25/24  1722      History   Chief Complaint Chief Complaint  Patient presents with   Exposure to STD    HPI Timothy Mitchell is a 23 y.o. male.   Patient presents requesting STD testing.  Patient states that he was told this morning that he was recently exposed to trichomonas by a recent sexual partner.  Patient denies any penile discharge, dysuria, urinary urgency/urgency, hematuria, penile/testicular pain or swelling, abdominal pain, flank pain, and fever.  The history is provided by the patient and medical records.  Exposure to STD    Past Medical History:  Diagnosis Date   Dental abscess 08/22/2011   Seizure disorder (HCC)    Last seizure seen approx 2 years ago   Seizures [facility])     Patient Active Problem List   Diagnosis Date Noted   Suicidal ideation 10/05/2017   Abnormal finding on EKG 12/17/2015   Seizure disorder, grand mal (HCC) 08/22/2011   Absence seizure disorder (HCC) 08/22/2011    Past Surgical History:  Procedure Laterality Date   NERVE, TENDON AND ARTERY REPAIR Right 06/10/2014   Procedure: RIGHT RING FINGER EXPLORATION WITH NERVE  REPAIR;  Surgeon: Brunilda Capra, MD;  Location: [facility];  Service: Orthopedics;  Laterality: Right;       Home Medications    Prior to Admission medications   Medication Sig Start Date End Date Taking? Authorizing Provider  azelastine  (ASTELIN ) 0.1 % nasal spray Place 2 sprays into both nostrils 2 (two) times daily. Use in each nostril as directed 02/17/24   Karon Packer, NP    Family History Family History  Problem Relation Age of Onset   Depression Mother    Learning disabilities Mother    Mental illness Mother    Mental illness Father    Birth defects Sister    Alcohol abuse Maternal Grandmother    Diabetes Maternal Grandmother    Alcohol abuse Maternal Grandfather    Cancer Maternal Grandfather    Diabetes Maternal  Grandfather    Heart disease Maternal Grandfather    Diabetes Paternal Grandmother     Social History Social History   Tobacco Use   Smoking status: Every Day    Types: Cigarettes    Passive exposure: Yes   Smokeless tobacco: Never  Vaping Use   Vaping status: Never Used  Substance Use Topics   Alcohol use: No   Drug use: Yes    Types: Marijuana     Allergies   Patient has no known allergies.   Review of Systems Review of Systems  Per HPI  Physical Exam Triage Vital Signs ED Triage Vitals  Encounter Vitals Group     BP 03/25/24 1811 126/75     Girls Systolic BP Percentile --      Girls Diastolic BP Percentile --      Boys Systolic BP Percentile --      Boys Diastolic BP Percentile --      Pulse Rate 03/25/24 1811 75     Resp 03/25/24 1811 14     Temp 03/25/24 1811 98.1 F (36.7 C)     Temp Source 03/25/24 1811 Oral     SpO2 03/25/24 1811 97 %     Weight --      Height --      Head Circumference --      Peak Flow --      Pain  Score 03/25/24 1809 0     Pain Loc --      Pain Education --      Exclude from Growth Chart --    No data found.  Updated Vital Signs BP 126/75 (BP Location: Right Arm)   Pulse 75   Temp 98.1 F (36.7 C) (Oral)   Resp 14   SpO2 97%   Visual Acuity Right Eye Distance:   Left Eye Distance:   Bilateral Distance:    Right Eye Near:   Left Eye Near:    Bilateral Near:     Physical Exam Vitals and nursing note reviewed.  Constitutional:      General: He is awake. He is not in acute distress.    Appearance: Normal appearance. He is well-developed and well-groomed. He is not ill-appearing.  Genitourinary:    Comments: Exam deferred  Neurological:     Mental Status: He is alert.   Psychiatric:        Behavior: Behavior is cooperative.      UC Treatments / Results  Labs (all labs ordered are listed, but only abnormal results are displayed) Labs Reviewed  CYTOLOGY, (ORAL, ANAL, URETHRAL) ANCILLARY ONLY     EKG   Radiology No results found.  Procedures Procedures (including critical care time)  Medications Ordered in UC Medications  metroNIDAZOLE  (FLAGYL ) tablet 2,000 mg (has no administration in time range)    Initial Impression / Assessment and Plan / UC Course  I have reviewed the triage vital signs and the nursing notes.  Pertinent labs & imaging results that were available during my care of the patient were reviewed by me and considered in my medical decision making (see chart for details).     Patient is well-appearing.  Vitals are stable.  GU exam deferred.  Patient perform self swab for STD.  HIV and RPR declined.  Given one-time dose of 2 g of metronidazole  in clinic for trichomonas coverage.  Discussed follow-up and return precautions. Final Clinical Impressions(s) / UC Diagnoses   Final diagnoses:  Exposure to trichomonas     Discharge Instructions      You were given a one-time dose of metronidazole  in clinic today which will cover for trichomonas infection. Recommend refraining from sexual intercourse for at least 7 days. Your results will come back over the next few days and someone will call if results are positive and require any additional treatment.  Return here as needed.      ED Prescriptions   None    PDMP not reviewed this encounter.   Levora Reas A, NP 03/25/24 [phone]

## 2024-03-25 NOTE — Discharge Instructions (Signed)
 You were given a one-time dose of metronidazole  in clinic today which will cover for trichomonas infection. Recommend refraining from sexual intercourse for at least 7 days. Your results will come back over the next few days and someone will call if results are positive and require any additional treatment.  Return here as needed.

## 2024-03-26 LAB — CYTOLOGY, (ORAL, ANAL, URETHRAL) ANCILLARY ONLY
Chlamydia: NEGATIVE
Comment: NEGATIVE
Comment: NEGATIVE
Comment: NORMAL
Neisseria Gonorrhea: NEGATIVE
Trichomonas: NEGATIVE
